# Patient Record
Sex: Female | Born: 1955 | Race: Black or African American | Hispanic: No | Marital: Married | Smoking: Never smoker
Health system: Southern US, Community
[De-identification: ages and names within clinical notes are randomized; demographics above are authoritative.]

---

## 2000-10-31 ENCOUNTER — Encounter: Payer: Self-pay | Admitting: *Deleted

## 2000-10-31 ENCOUNTER — Ambulatory Visit (HOSPITAL_COMMUNITY): Admission: RE | Admit: 2000-10-31 | Discharge: 2000-10-31 | Payer: Self-pay | Admitting: *Deleted

## 2001-11-04 ENCOUNTER — Ambulatory Visit (HOSPITAL_COMMUNITY): Admission: RE | Admit: 2001-11-04 | Discharge: 2001-11-04 | Payer: Self-pay | Admitting: *Deleted

## 2001-11-04 ENCOUNTER — Encounter: Payer: Self-pay | Admitting: *Deleted

## 2001-12-01 ENCOUNTER — Emergency Department (HOSPITAL_COMMUNITY): Admission: EM | Admit: 2001-12-01 | Discharge: 2001-12-01 | Payer: Self-pay | Admitting: Emergency Medicine

## 2002-11-12 ENCOUNTER — Encounter: Payer: Self-pay | Admitting: *Deleted

## 2002-11-12 ENCOUNTER — Ambulatory Visit (HOSPITAL_COMMUNITY): Admission: RE | Admit: 2002-11-12 | Discharge: 2002-11-12 | Payer: Self-pay | Admitting: *Deleted

## 2005-01-31 ENCOUNTER — Encounter: Payer: Self-pay | Admitting: Unknown Physician Specialty

## 2005-02-11 ENCOUNTER — Encounter: Payer: Self-pay | Admitting: Unknown Physician Specialty

## 2010-07-12 ENCOUNTER — Other Ambulatory Visit (HOSPITAL_COMMUNITY): Payer: Self-pay | Admitting: Family Medicine

## 2010-07-12 ENCOUNTER — Other Ambulatory Visit: Payer: Self-pay

## 2010-07-12 DIAGNOSIS — J329 Chronic sinusitis, unspecified: Secondary | ICD-10-CM

## 2010-07-12 DIAGNOSIS — H6690 Otitis media, unspecified, unspecified ear: Secondary | ICD-10-CM

## 2010-07-13 ENCOUNTER — Other Ambulatory Visit (HOSPITAL_COMMUNITY): Payer: Self-pay

## 2010-07-14 ENCOUNTER — Ambulatory Visit (HOSPITAL_COMMUNITY)
Admission: RE | Admit: 2010-07-14 | Discharge: 2010-07-14 | Disposition: A | Discharge status: Home / Self Care | Payer: 59 | Source: Ambulatory Visit | Attending: Family Medicine | Admitting: Family Medicine

## 2010-07-14 DIAGNOSIS — J329 Chronic sinusitis, unspecified: Secondary | ICD-10-CM | POA: Insufficient documentation

## 2010-07-14 DIAGNOSIS — R42 Dizziness and giddiness: Secondary | ICD-10-CM | POA: Insufficient documentation

## 2010-07-14 DIAGNOSIS — R209 Unspecified disturbances of skin sensation: Secondary | ICD-10-CM | POA: Insufficient documentation

## 2010-07-14 DIAGNOSIS — H6690 Otitis media, unspecified, unspecified ear: Secondary | ICD-10-CM | POA: Insufficient documentation

## 2010-07-14 DIAGNOSIS — R51 Headache: Secondary | ICD-10-CM | POA: Insufficient documentation

## 2010-07-14 DIAGNOSIS — H538 Other visual disturbances: Secondary | ICD-10-CM | POA: Insufficient documentation

## 2010-09-25 ENCOUNTER — Other Ambulatory Visit (HOSPITAL_COMMUNITY): Payer: Self-pay | Admitting: Family Medicine

## 2010-09-25 DIAGNOSIS — N63 Unspecified lump in unspecified breast: Secondary | ICD-10-CM

## 2010-10-11 ENCOUNTER — Ambulatory Visit (HOSPITAL_COMMUNITY): Admission: RE | Admit: 2010-10-11 | Payer: 59 | Source: Ambulatory Visit

## 2010-11-16 ENCOUNTER — Ambulatory Visit (HOSPITAL_COMMUNITY): Payer: 59

## 2010-11-29 ENCOUNTER — Ambulatory Visit (HOSPITAL_COMMUNITY)
Admission: RE | Admit: 2010-11-29 | Discharge: 2010-11-29 | Disposition: A | Discharge status: Home / Self Care | Payer: 59 | Source: Ambulatory Visit | Attending: Family Medicine | Admitting: Family Medicine

## 2010-11-29 DIAGNOSIS — N63 Unspecified lump in unspecified breast: Secondary | ICD-10-CM

## 2010-11-29 DIAGNOSIS — R928 Other abnormal and inconclusive findings on diagnostic imaging of breast: Secondary | ICD-10-CM | POA: Insufficient documentation

## 2016-01-31 ENCOUNTER — Encounter (INDEPENDENT_AMBULATORY_CARE_PROVIDER_SITE_OTHER): Payer: Self-pay | Admitting: *Deleted

## 2016-04-03 ENCOUNTER — Other Ambulatory Visit: Payer: Self-pay | Admitting: Emergency Medicine

## 2016-04-03 DIAGNOSIS — R928 Other abnormal and inconclusive findings on diagnostic imaging of breast: Secondary | ICD-10-CM

## 2020-02-18 ENCOUNTER — Encounter (HOSPITAL_COMMUNITY): Payer: Self-pay | Admitting: Emergency Medicine

## 2020-02-18 ENCOUNTER — Other Ambulatory Visit: Payer: Self-pay

## 2020-02-18 ENCOUNTER — Emergency Department (HOSPITAL_COMMUNITY)
Admission: EM | Admit: 2020-02-18 | Discharge: 2020-02-19 | Disposition: A | Discharge status: Home / Self Care | Payer: BLUE CROSS/BLUE SHIELD | Attending: Emergency Medicine | Admitting: Emergency Medicine

## 2020-02-18 DIAGNOSIS — Y9389 Activity, other specified: Secondary | ICD-10-CM | POA: Diagnosis not present

## 2020-02-18 DIAGNOSIS — Y92410 Unspecified street and highway as the place of occurrence of the external cause: Secondary | ICD-10-CM | POA: Insufficient documentation

## 2020-02-18 DIAGNOSIS — S8392XA Sprain of unspecified site of left knee, initial encounter: Secondary | ICD-10-CM | POA: Diagnosis not present

## 2020-02-18 DIAGNOSIS — M25511 Pain in right shoulder: Secondary | ICD-10-CM

## 2020-02-18 DIAGNOSIS — M25512 Pain in left shoulder: Secondary | ICD-10-CM

## 2020-02-18 DIAGNOSIS — S8992XA Unspecified injury of left lower leg, initial encounter: Secondary | ICD-10-CM | POA: Diagnosis present
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
## 2020-02-19 ENCOUNTER — Emergency Department (HOSPITAL_COMMUNITY): Payer: BLUE CROSS/BLUE SHIELD

## 2020-02-19 DIAGNOSIS — S8392XA Sprain of unspecified site of left knee, initial encounter: Secondary | ICD-10-CM | POA: Diagnosis not present

## 2020-02-19 MED ORDER — IBUPROFEN 400 MG PO TABS
400.0000 mg | ORAL_TABLET | Freq: Once | ORAL | Status: AC
  Administered 2020-02-19: 400 mg via ORAL
  Filled 2020-02-19: qty 1
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
